# Patient Record
Sex: Male | Born: 1980 | Race: Black or African American | Hispanic: No | Marital: Single | Smoking: Never smoker
Health system: Southern US, Community
[De-identification: ages and names within clinical notes are randomized; demographics above are authoritative.]

## PROBLEM LIST (undated history)

## (undated) ENCOUNTER — Emergency Department (HOSPITAL_COMMUNITY): Discharge status: Against Medical Advice | Payer: PRIVATE HEALTH INSURANCE

## (undated) DIAGNOSIS — B20 Human immunodeficiency virus [HIV] disease: Secondary | ICD-10-CM

---

## 2004-01-20 ENCOUNTER — Emergency Department (HOSPITAL_COMMUNITY): Admission: EM | Admit: 2004-01-20 | Discharge: 2004-01-21 | Payer: Self-pay | Admitting: Emergency Medicine

## 2004-03-17 ENCOUNTER — Emergency Department (HOSPITAL_COMMUNITY): Admission: EM | Admit: 2004-03-17 | Discharge: 2004-03-17 | Payer: Self-pay | Admitting: Emergency Medicine

## 2005-07-20 ENCOUNTER — Emergency Department (HOSPITAL_COMMUNITY): Admission: EM | Admit: 2005-07-20 | Discharge: 2005-07-20 | Payer: Self-pay | Admitting: Emergency Medicine

## 2006-04-03 ENCOUNTER — Emergency Department (HOSPITAL_COMMUNITY): Admission: EM | Admit: 2006-04-03 | Discharge: 2006-04-03 | Payer: Self-pay | Admitting: Family Medicine

## 2007-01-01 ENCOUNTER — Emergency Department (HOSPITAL_COMMUNITY): Admission: EM | Admit: 2007-01-01 | Discharge: 2007-01-01 | Payer: Self-pay | Admitting: Family Medicine

## 2007-07-16 ENCOUNTER — Emergency Department (HOSPITAL_COMMUNITY): Admission: EM | Admit: 2007-07-16 | Discharge: 2007-07-16 | Payer: Self-pay | Admitting: Family Medicine

## 2007-10-12 ENCOUNTER — Emergency Department (HOSPITAL_COMMUNITY): Admission: EM | Admit: 2007-10-12 | Discharge: 2007-10-12 | Payer: Self-pay | Admitting: Family Medicine

## 2007-11-28 ENCOUNTER — Emergency Department (HOSPITAL_COMMUNITY): Admission: EM | Admit: 2007-11-28 | Discharge: 2007-11-28 | Payer: Self-pay | Admitting: Emergency Medicine

## 2007-11-29 ENCOUNTER — Emergency Department (HOSPITAL_COMMUNITY): Admission: EM | Admit: 2007-11-29 | Discharge: 2007-11-29 | Payer: Self-pay | Admitting: Family Medicine

## 2009-12-28 IMAGING — CR DG CHEST 2V
2 series · 2 of 2 positions shown · non-contrast
Comparison: None

CLINICAL DATA: Cough for a week

CHEST - 2 VIEW

[view not recorded (1 of 2)]
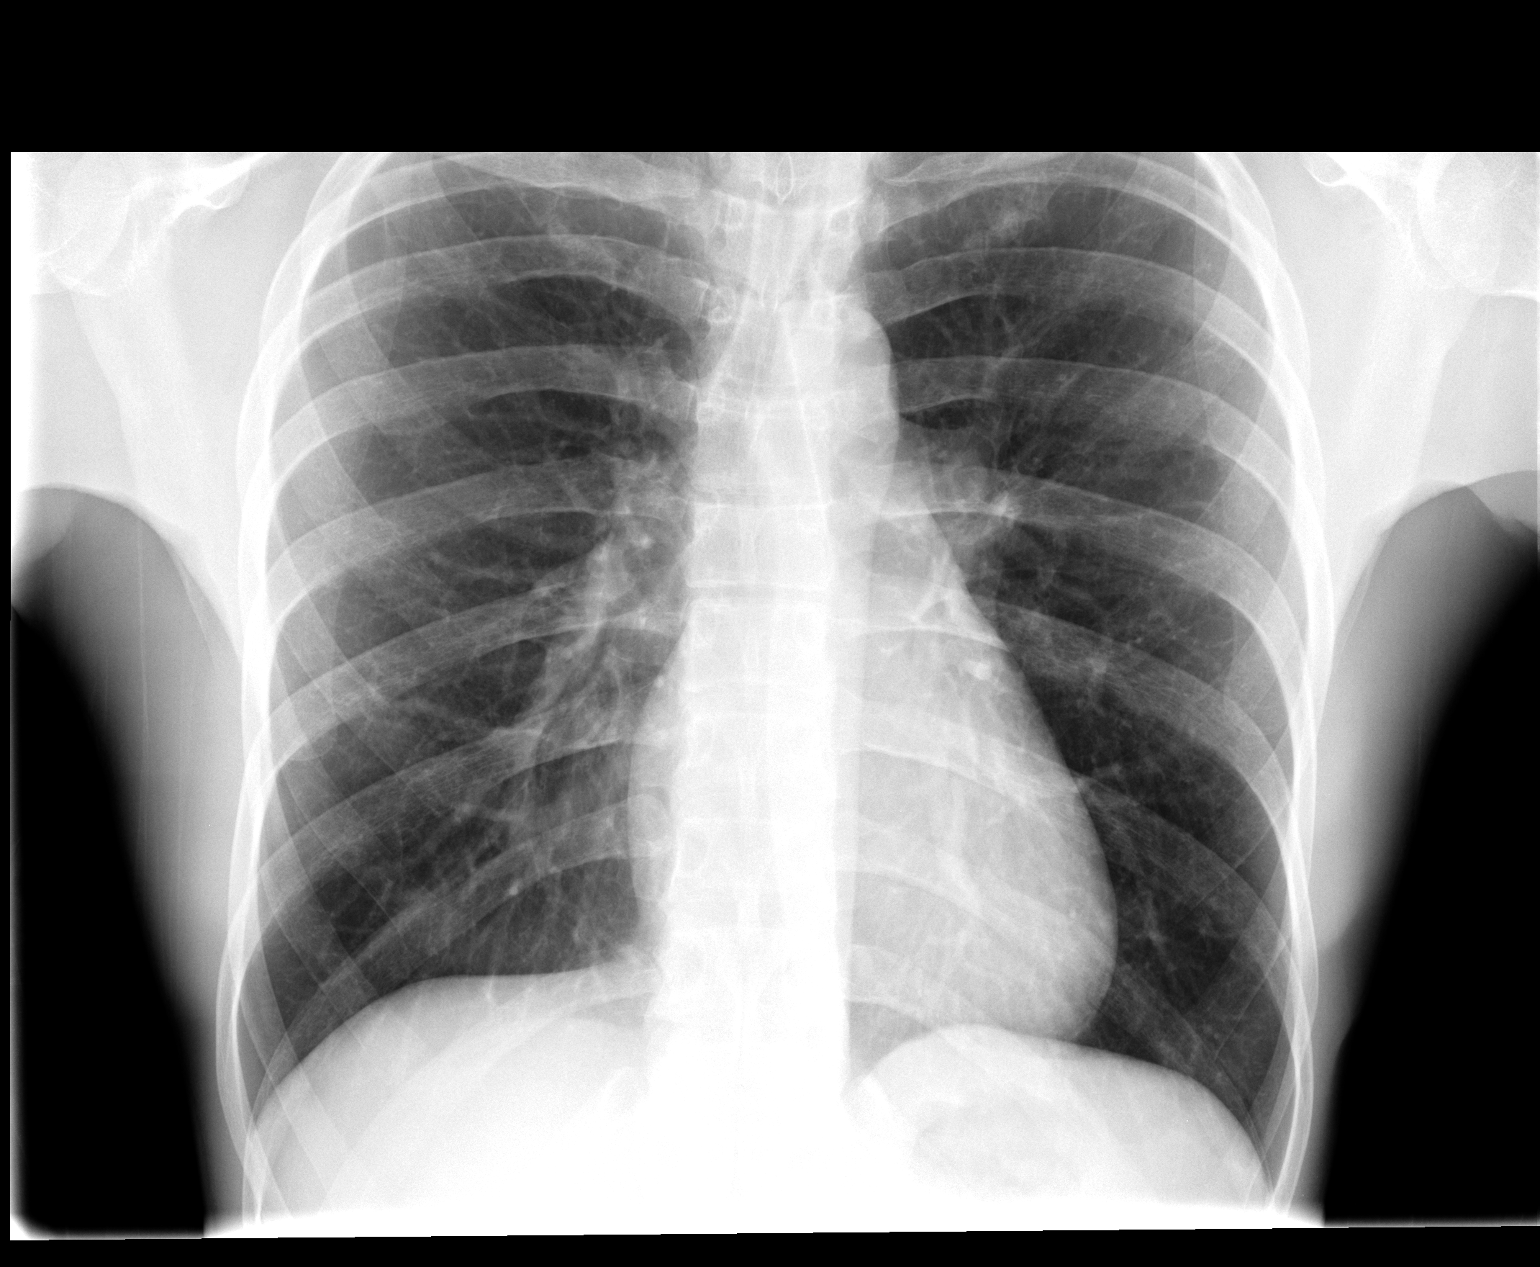

[view not recorded (2 of 2)]
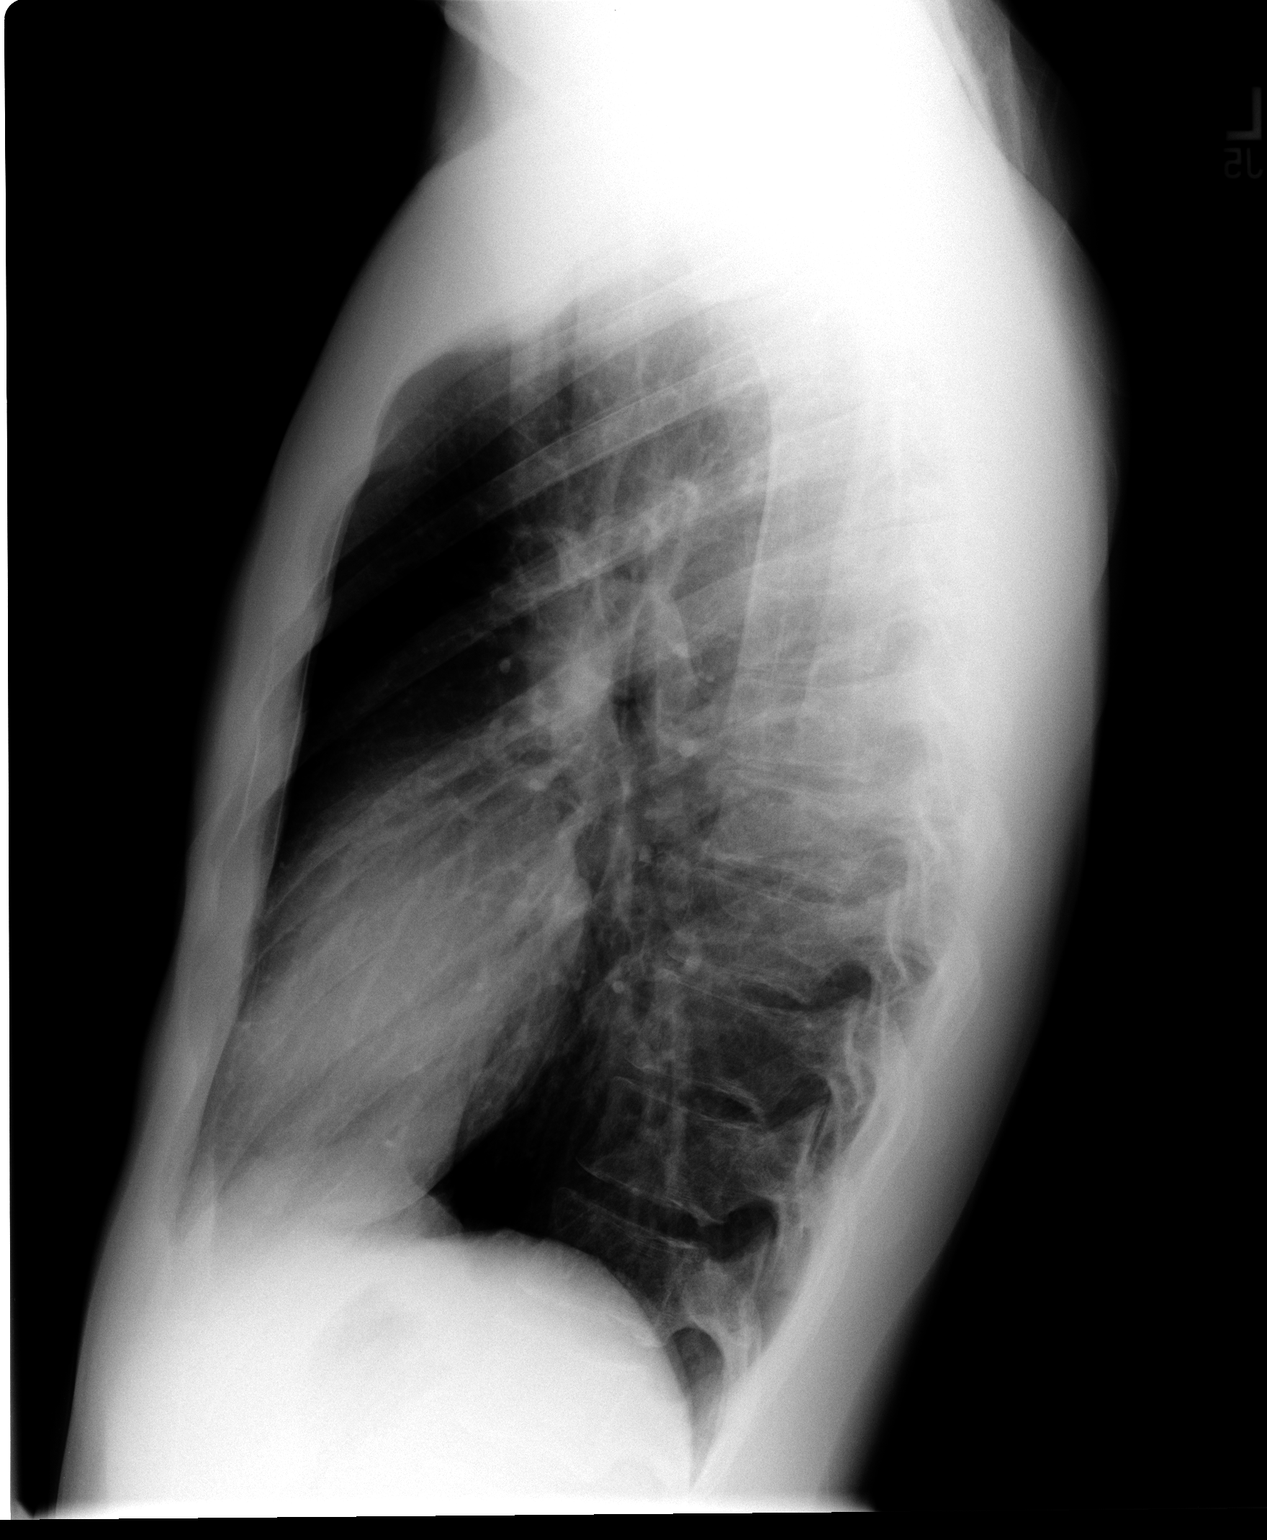

[2 of 2 positions shown; findings below may reference images not displayed]

FINDINGS: The lungs are clear but hyperaerated.  The heart is
within normal limits in size. No bony abnormality is seen.
IMPRESSION: Hyperaeration. No active lung disease.

## 2010-10-07 LAB — RPR: RPR Ser Ql: REACTIVE — AB

## 2010-10-07 LAB — T.PALLIDUM AB, IGG: T pallidum Antibodies (TP-PA): 32.1 IV — ABNORMAL HIGH (ref <1.0)

## 2011-05-30 ENCOUNTER — Emergency Department (HOSPITAL_COMMUNITY)
Admission: EM | Admit: 2011-05-30 | Discharge: 2011-05-30 | Disposition: A | Discharge status: Home / Self Care | Payer: No Typology Code available for payment source | Attending: Emergency Medicine | Admitting: Emergency Medicine

## 2011-05-30 ENCOUNTER — Emergency Department (HOSPITAL_COMMUNITY): Payer: No Typology Code available for payment source

## 2011-05-30 ENCOUNTER — Encounter (HOSPITAL_COMMUNITY): Payer: Self-pay

## 2011-05-30 DIAGNOSIS — S6000XA Contusion of unspecified finger without damage to nail, initial encounter: Secondary | ICD-10-CM | POA: Insufficient documentation

## 2011-05-30 DIAGNOSIS — Y9383 Activity, rough housing and horseplay: Secondary | ICD-10-CM | POA: Insufficient documentation

## 2011-05-30 DIAGNOSIS — S60042A Contusion of left ring finger without damage to nail, initial encounter: Secondary | ICD-10-CM

## 2011-05-30 DIAGNOSIS — W19XXXA Unspecified fall, initial encounter: Secondary | ICD-10-CM | POA: Insufficient documentation

## 2011-05-30 DIAGNOSIS — Y998 Other external cause status: Secondary | ICD-10-CM | POA: Insufficient documentation

## 2011-05-30 MED ORDER — IBUPROFEN 600 MG PO TABS: 600.0000 mg | ORAL_TABLET | Freq: Three times a day (TID) | ORAL | Status: AC | PRN

## 2011-05-30 MED ORDER — IBUPROFEN 200 MG PO TABS
600.0000 mg | ORAL_TABLET | Freq: Once | ORAL | Status: AC
  Administered 2011-05-30: 600 mg via ORAL
  Filled 2011-05-30: qty 3

## 2011-05-30 NOTE — ED Provider Notes (Signed)
History   This chart was scribed for [name], MD by Melba Coon. The patient was seen in room STRE4/STRE4 and the patient's care was started at 2:05PM.    CSN: [id number]  Arrival date & time 05/30/11  1322   First MD Initiated Contact with Patient 05/30/11 1347      Chief Complaint  Patient presents with  . Hand Pain    (Consider location/radiation/quality/duration/timing/severity/associated sxs/prior treatment) HPI Juan Mullen is a 31 y.o. male who presents to the Emergency Department complaining of constant, moderate to severe left ring finger pain with an onset last night. Pt was playing with his dog last night when he accidentally fell on his finger. Pt presented to the ED to check for a fx. Ibuprofen alleviates the pain. No HA, fever, neck pain, sore throat, rash, back pain, CP, SOB, abd pain, n/v/d, dysuria, or extremity edema, weakness, numbness, or tingling. No known allergies. No other pertinent medical symptoms.   History reviewed. No pertinent past medical history.  History reviewed. No pertinent past surgical history.  No family history on file.  History  Substance Use Topics  . Smoking status: Never Smoker   . Smokeless tobacco: Not on file  . Alcohol Use: No      Review of Systems 10 Systems reviewed and all are negative for acute change except as noted in the HPI.   Allergies  Review of patient's allergies indicates no known allergies.  Home Medications  No current outpatient prescriptions on file.  BP 128/74  Pulse 73  Temp(Src) 98.9 F (37.2 C) (Oral)  Resp 18  SpO2 98%  Physical Exam  Nursing note and vitals reviewed. Constitutional: He is oriented to person, place, and time. He appears well-developed and well-nourished. No distress.  HENT:  Head: Normocephalic and atraumatic.  Eyes: EOM are normal.  Neck: Neck supple. No tracheal deviation present.  Cardiovascular: Normal rate.   Pulmonary/Chest: Effort normal. No  respiratory distress.  Musculoskeletal: Normal range of motion.       Left 4th digit of hand: Distal phalanx tenderness w/ small amount of ecchymosis under fingernail; no tenderness of proximal phalanx  Neurological: He is alert and oriented to person, place, and time.  Skin: Skin is warm and dry.  Psychiatric: He has a normal mood and affect. His behavior is normal.    ED Course  SPLINT APPLICATION Performed by: [name] Authorized by: [name] Consent: Verbal consent obtained. Consent given by: patient Location: Left ring finger. Splint type: Buddy taping the fingers. Post-procedure: The splinted body part was neurovascularly unchanged following the procedure. Patient tolerance: Patient tolerated the procedure well with no immediate complications.   (including critical care time)  DIAGNOSTIC STUDIES: Oxygen Saturation is 98% on room air, normal by my interpretation.    COORDINATION OF CARE:  2:10PM - EDMD reviewed imaging and radiologist findings; no fx noted. EDMD taped up and stabilized the finger. F/u with PCP if necessary. Pt ready for d/c.   Labs Reviewed - No data to display No results found.   1. Contusion of left ring finger without damage to nail       MDM  Buddy tape the fingers.  No obvious fracture.  DC home in good condition  I personally performed the services described in this documentation, which was scribed in my presence. The recorded information has been reviewed and considered.          [name], MD 05/30/11 1415

## 2011-05-30 NOTE — ED Notes (Signed)
Pt. Playing with his dog last night and jammed his lt. Ring finger,.

## 2011-06-16 ENCOUNTER — Emergency Department (INDEPENDENT_AMBULATORY_CARE_PROVIDER_SITE_OTHER): Payer: PRIVATE HEALTH INSURANCE

## 2011-06-16 ENCOUNTER — Encounter (HOSPITAL_COMMUNITY): Payer: Self-pay | Admitting: *Deleted

## 2011-06-16 ENCOUNTER — Emergency Department (INDEPENDENT_AMBULATORY_CARE_PROVIDER_SITE_OTHER)
Admission: EM | Admit: 2011-06-16 | Discharge: 2011-06-16 | Disposition: A | Discharge status: Home / Self Care | Payer: PRIVATE HEALTH INSURANCE | Source: Home / Self Care | Attending: Emergency Medicine | Admitting: Emergency Medicine

## 2011-06-16 DIAGNOSIS — IMO0002 Reserved for concepts with insufficient information to code with codable children: Secondary | ICD-10-CM

## 2011-06-16 DIAGNOSIS — S29011A Strain of muscle and tendon of front wall of thorax, initial encounter: Secondary | ICD-10-CM

## 2011-06-16 MED ORDER — HYDROCODONE-ACETAMINOPHEN 5-325 MG PO TABS: 2.0000 | ORAL_TABLET | ORAL | Status: AC | PRN

## 2011-06-16 MED ORDER — IBUPROFEN 800 MG PO TABS: 800.0000 mg | ORAL_TABLET | Freq: Three times a day (TID) | ORAL | Status: AC

## 2011-06-16 NOTE — ED Provider Notes (Signed)
History     CSN: [id number]  Arrival date & time 06/16/11  4098   First MD Initiated Contact with Patient 06/16/11 1914      Chief Complaint  Patient presents with  . Shoulder Pain    (Consider location/radiation/quality/duration/timing/severity/associated sxs/prior treatment) Patient is a 31 y.o. male presenting with chest pain. The history is provided by the patient. No language interpreter was used.  Chest Pain The chest pain began 3 - 5 days ago. Chest pain occurs constantly. The chest pain is worsening. The pain is associated with breathing and coughing. At its most intense, the pain is at 6/10. The pain is currently at 6/10. The severity of the pain is moderate. The quality of the pain is described as aching. The pain does not radiate. Chest pain is worsened by deep breathing. He tried nothing for the symptoms. Risk factors include no known risk factors.    patient reports he awoke with pain in the right side of chest 2 days ago. Patient reports he thought that he had slept wrong however he has pain when he takes a deep breath patient reports pain has not improved he denies any shortness of breath he has not had any cough  History reviewed. No pertinent past medical history.  History reviewed. No pertinent past surgical history.  History reviewed. No pertinent family history.  History  Substance Use Topics  . Smoking status: Never Smoker   . Smokeless tobacco: Not on file  . Alcohol Use: Yes     occasonally      Review of Systems  Cardiovascular: Positive for chest pain.  All other systems reviewed and are negative.    Allergies  Review of patient's allergies indicates no known allergies.  Home Medications  No current outpatient prescriptions on file.  BP 136/88  Pulse 74  Temp(Src) 98.1 F (36.7 C) (Oral)  Resp 18  SpO2 100%  Physical Exam  Nursing note and vitals reviewed. Constitutional: He is oriented to person, place, and time. He appears  well-developed and well-nourished.  Eyes: Pupils are equal, round, and reactive to light.  Neck: Neck supple.  Cardiovascular: Normal rate and normal heart sounds.   Pulmonary/Chest: Effort normal and breath sounds normal. He exhibits tenderness.       Tender right anterior chest wall, pain reproduced with a deep breath  Abdominal: Soft. Bowel sounds are normal.  Musculoskeletal: Normal range of motion.  Neurological: He is alert and oriented to person, place, and time.  Skin: Skin is warm.  Psychiatric: He has a normal mood and affect.    ED Course  Procedures (including critical care time)  Labs Reviewed - No data to display No results found.   1. Muscle strain of chest wall       MDM  Chest xray normal,   Pt advised probable muscle strain.   I will treat with ibuprofen and hydrocodone.          Lonia Skinner [place], [place] 06/16/11 1951  Lonia Skinner [place], [place] 06/16/11 [phone]

## 2011-06-16 NOTE — Discharge Instructions (Signed)
Muscle Strain A muscle strain, or pulled muscle, occurs when a muscle is over-stretched. A small number of muscle fibers may also be torn. This is especially common in athletes. This happens when a sudden violent force placed on a muscle pushes it past its capacity. Usually, recovery from a pulled muscle takes 1 to 2 weeks. But complete healing will take 5 to 6 weeks. There are millions of muscle fibers. Following injury, your body will usually return to normal quickly. HOME CARE INSTRUCTIONS   While awake, apply ice to the sore muscle for 15 to 20 minutes each hour for the first 2 days. Put ice in a plastic bag and place a towel between the bag of ice and your skin.   Do not use the pulled muscle for several days. Do not use the muscle if you have pain.   You may wrap the injured area with an elastic bandage for comfort. Be careful not to bind it too tightly. This may interfere with blood circulation.   Only take over-the-counter or prescription medicines for pain, discomfort, or fever as directed by your caregiver. Do not use aspirin as this will increase bleeding (bruising) at injury site.   Warming up before exercise helps prevent muscle strains.  SEEK MEDICAL CARE IF:  There is increased pain or swelling in the affected area. MAKE SURE YOU:   Understand these instructions.   Will watch your condition.   Will get help right away if you are not doing well or get worse.  Document Released: 12/22/2004 Document Revised: 12/11/2010 Document Reviewed: 07/21/2006 [employer] Patient Information 2012 [employer]. 

## 2011-06-16 NOTE — ED Provider Notes (Signed)
Medical screening examination/treatment/procedure(s) were performed by non-physician practitioner and as supervising physician I was immediately available for consultation/collaboration.  Leslee Home, M.D.   Reuben Likes, MD 06/16/11 2126

## 2011-06-16 NOTE — ED Notes (Signed)
Pt  Reports  r  Shoulder  Pain   As  Well  As  Pain  r  Upper  Back  And  r  Side  Of  His  Chest  Which  He  Reports  Is  Worse  On  Movement  And  Deep  Breath       He at  This  Time  Is  Awake   And  Alert

## 2011-07-26 ENCOUNTER — Emergency Department (HOSPITAL_COMMUNITY)
Admission: EM | Admit: 2011-07-26 | Discharge: 2011-07-27 | Discharge status: Against Medical Advice | Payer: Self-pay | Attending: Emergency Medicine | Admitting: Emergency Medicine

## 2011-07-26 DIAGNOSIS — R6883 Chills (without fever): Secondary | ICD-10-CM | POA: Insufficient documentation

## 2011-07-27 LAB — URINALYSIS, ROUTINE W REFLEX MICROSCOPIC
Bilirubin Urine: NEGATIVE
Glucose, UA: NEGATIVE mg/dL
Hgb urine dipstick: NEGATIVE
Ketones, ur: NEGATIVE mg/dL
Leukocytes, UA: NEGATIVE
Nitrite: NEGATIVE
Protein, ur: NEGATIVE mg/dL
Specific Gravity, Urine: 1.022 (ref 1.005–1.030)
Urobilinogen, UA: 1 mg/dL (ref 0.0–1.0)
pH: 6.5 (ref 5.0–8.0)

## 2012-04-29 ENCOUNTER — Emergency Department (INDEPENDENT_AMBULATORY_CARE_PROVIDER_SITE_OTHER)
Admission: EM | Admit: 2012-04-29 | Discharge: 2012-04-29 | Disposition: A | Discharge status: Home / Self Care | Payer: No Typology Code available for payment source | Source: Home / Self Care

## 2012-04-29 ENCOUNTER — Encounter (HOSPITAL_COMMUNITY): Payer: Self-pay | Admitting: *Deleted

## 2012-04-29 DIAGNOSIS — M222X2 Patellofemoral disorders, left knee: Secondary | ICD-10-CM

## 2012-04-29 DIAGNOSIS — M25569 Pain in unspecified knee: Secondary | ICD-10-CM

## 2012-04-29 NOTE — ED Provider Notes (Signed)
Medical screening examination/treatment/procedure(s) were performed by resident physician or non-physician practitioner and as supervising physician I was immediately available for consultation/collaboration.   Kentrel Clevenger DOUGLAS MD.   Mckenlee Mangham D Bren Borys, MD 04/29/12 2026 

## 2012-04-29 NOTE — ED Provider Notes (Signed)
History     CSN: [id number]  Arrival date & time 04/29/12  1338   None     Chief Complaint  Patient presents with  . Knee Pain    (Consider location/radiation/quality/duration/timing/severity/associated sxs/prior treatment) HPI Comments: This 32 year old male is complaining of a gradual and insidious onset of left knee pain over the past week. He denies any injury or trauma. Previously he had been working out at the [employer]. The pain only occurs with flexion. When bending the knee beyond 90 he hears a pop. The source of pain is over it the superior aspect of the patella. It does not hurt when ascending or descending steps. However it is worse after sitting for prolonged period of time in getting up and walking. Otherwise there is no pain with ambulation.   History reviewed. No pertinent past medical history.  History reviewed. No pertinent past surgical history.  No family history on file.  History  Substance Use Topics  . Smoking status: Never Smoker   . Smokeless tobacco: Not on file  . Alcohol Use: Yes     Comment: occasonally      Review of Systems  Constitutional: Negative.   Respiratory: Negative.   Gastrointestinal: Negative.   Genitourinary: Negative.   Musculoskeletal:       As per HPI  Skin: Negative.   Neurological: Negative for dizziness, weakness, numbness and headaches.    Allergies  Review of patient's allergies indicates no known allergies.  Home Medications  No current outpatient prescriptions on file.  BP 127/38  Pulse 8  Temp(Src) 98.5 F (36.9 C) (Oral)  Resp 16  SpO2 100%  Physical Exam  Nursing note and vitals reviewed. Constitutional: He is oriented to person, place, and time. He appears well-developed and well-nourished.  HENT:  Head: Normocephalic and atraumatic.  Eyes: EOM are normal. Left eye exhibits no discharge.  Neck: Normal range of motion. Neck supple.  Musculoskeletal:  And swelling, deformity or discoloration. Flexion  beyond 135. Extension to 180. Negative varus, and negative valgus. Drawer test is negative no laxity. Patient able to bear full weight. He is able to squat and is produces pain over the patellofemoral ligament. Is no tenderness over the patella, bony prominences medial or lateral aspect of the knee.  Neurological: He is alert and oriented to person, place, and time. No cranial nerve deficit.  Skin: Skin is warm and dry.  Psychiatric: He has a normal mood and affect.    ED Course  Procedures (including critical care time)  Labs Reviewed - No data to display No results found.   1. Patellofemoral pain syndrome, left       MDM  Verbal and written information regarding patellofemoral pain syndrome. May apply ice as needed. May take ibuprofen or Aleve when necessary inflammation and swelling. Perform the rehabilitation maneuvers to help with the pain. May return if worse otherwise followup with primary care doctor.        Hayden Rasmussen, NP 04/29/12 [phone]

## 2012-04-29 NOTE — ED Notes (Signed)
Pt  Reports  l  Knee  Pain  For  About  1  Week        -        Worse  Yesterday           - Denys    specefic  inj   Pain when  Bends  The  Knee     Pain  On  Ambulation      Denys  Any  Other  Symptoms

## 2013-06-29 IMAGING — CR DG FINGER RING 2+V*L*
3 series · 3 of 3 positions shown · non-contrast
Comparison: None

CLINICAL DATA: Pain after falling last night.  Pain in the DIP
joint, tuft.

LEFT RING FINGER 2+V

[x finger pa left]
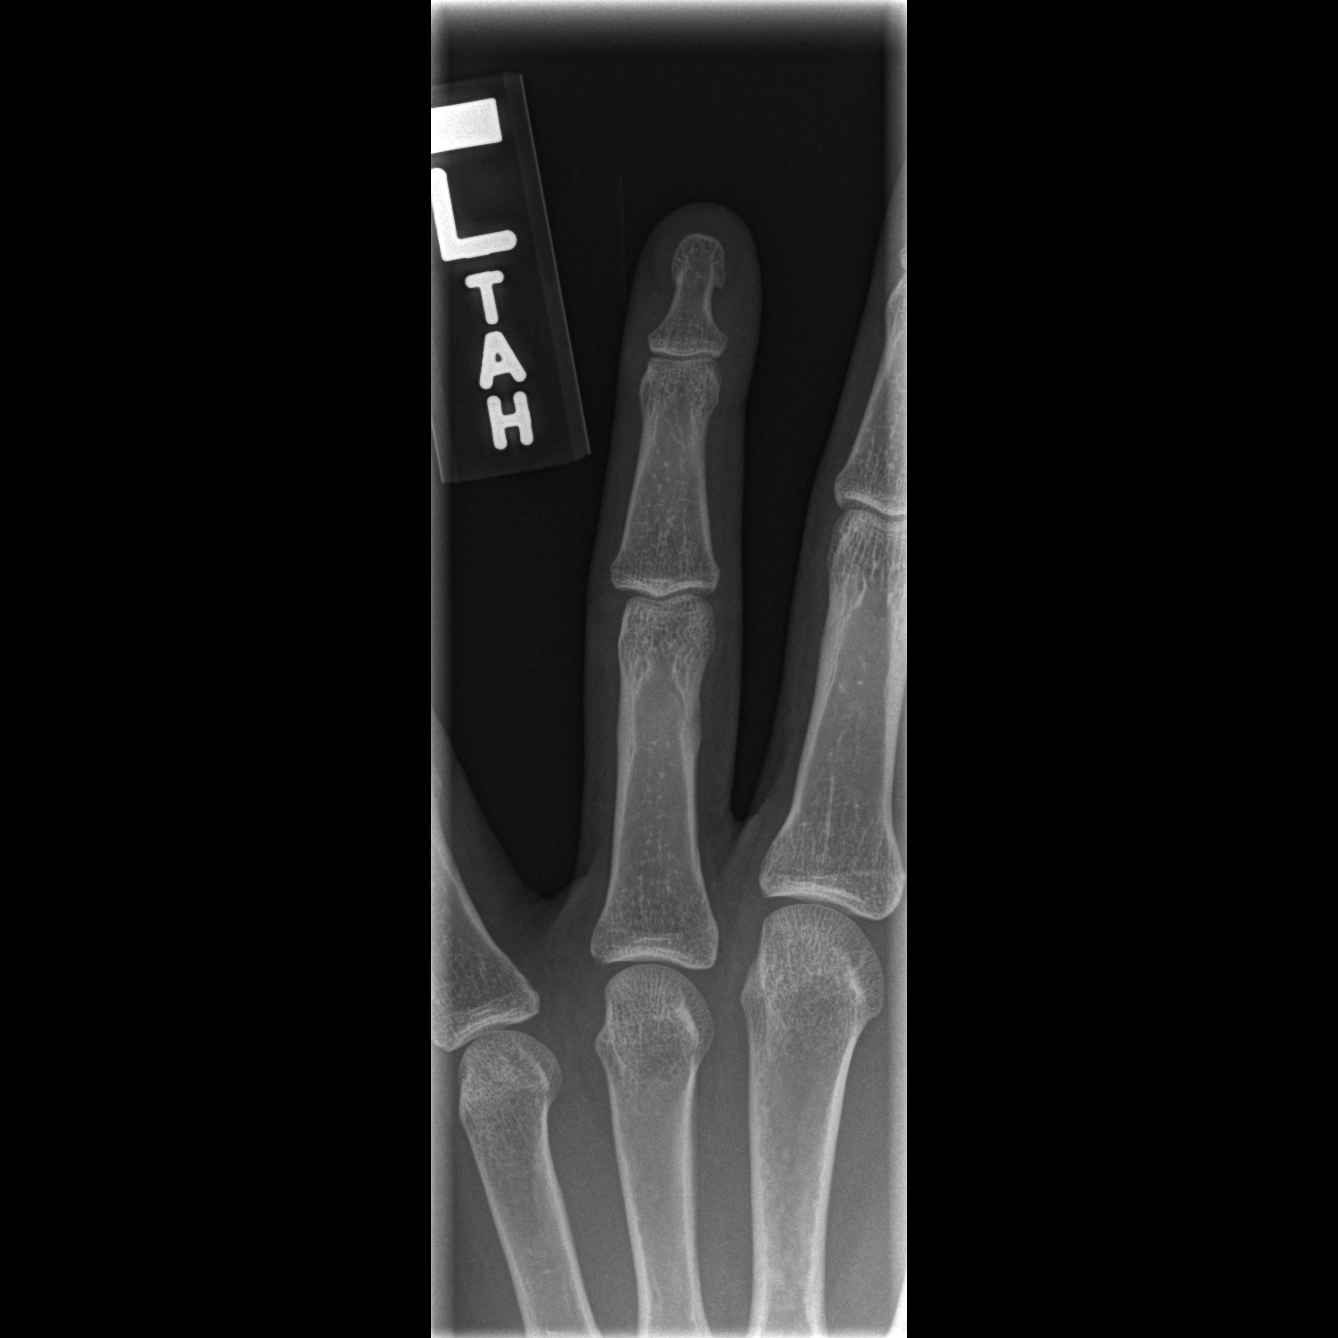

[x finger obl. left]
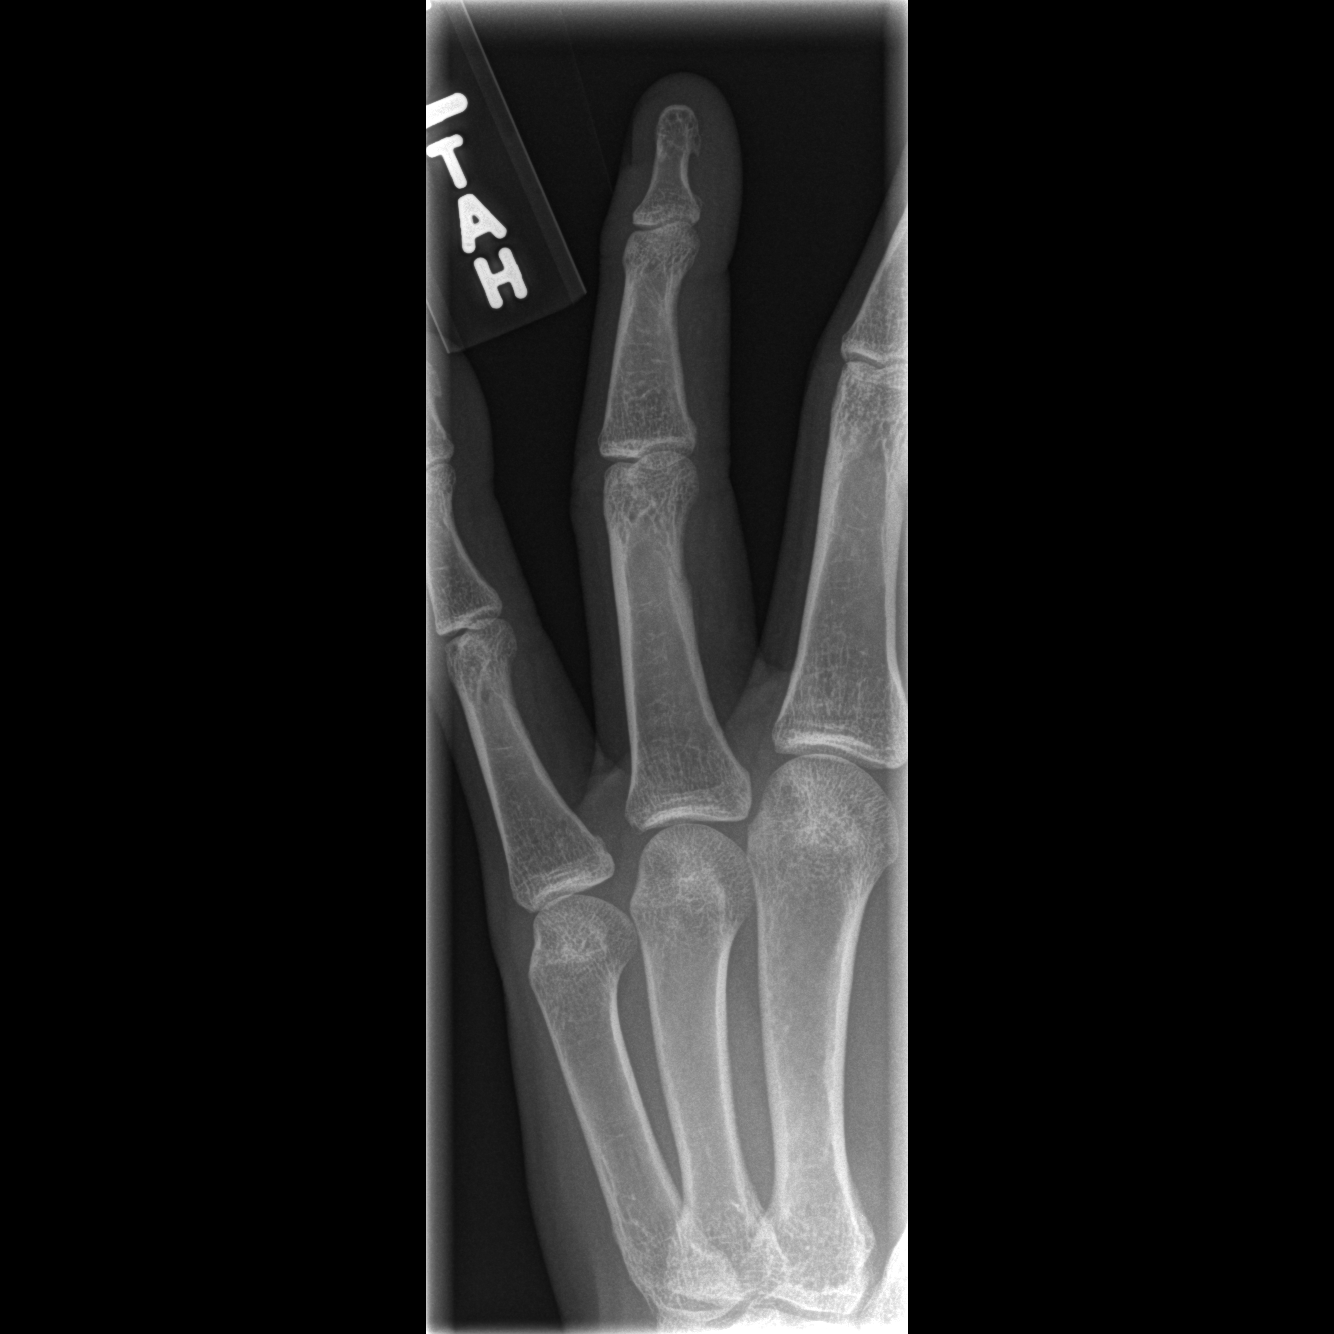

[x finger lateral left]
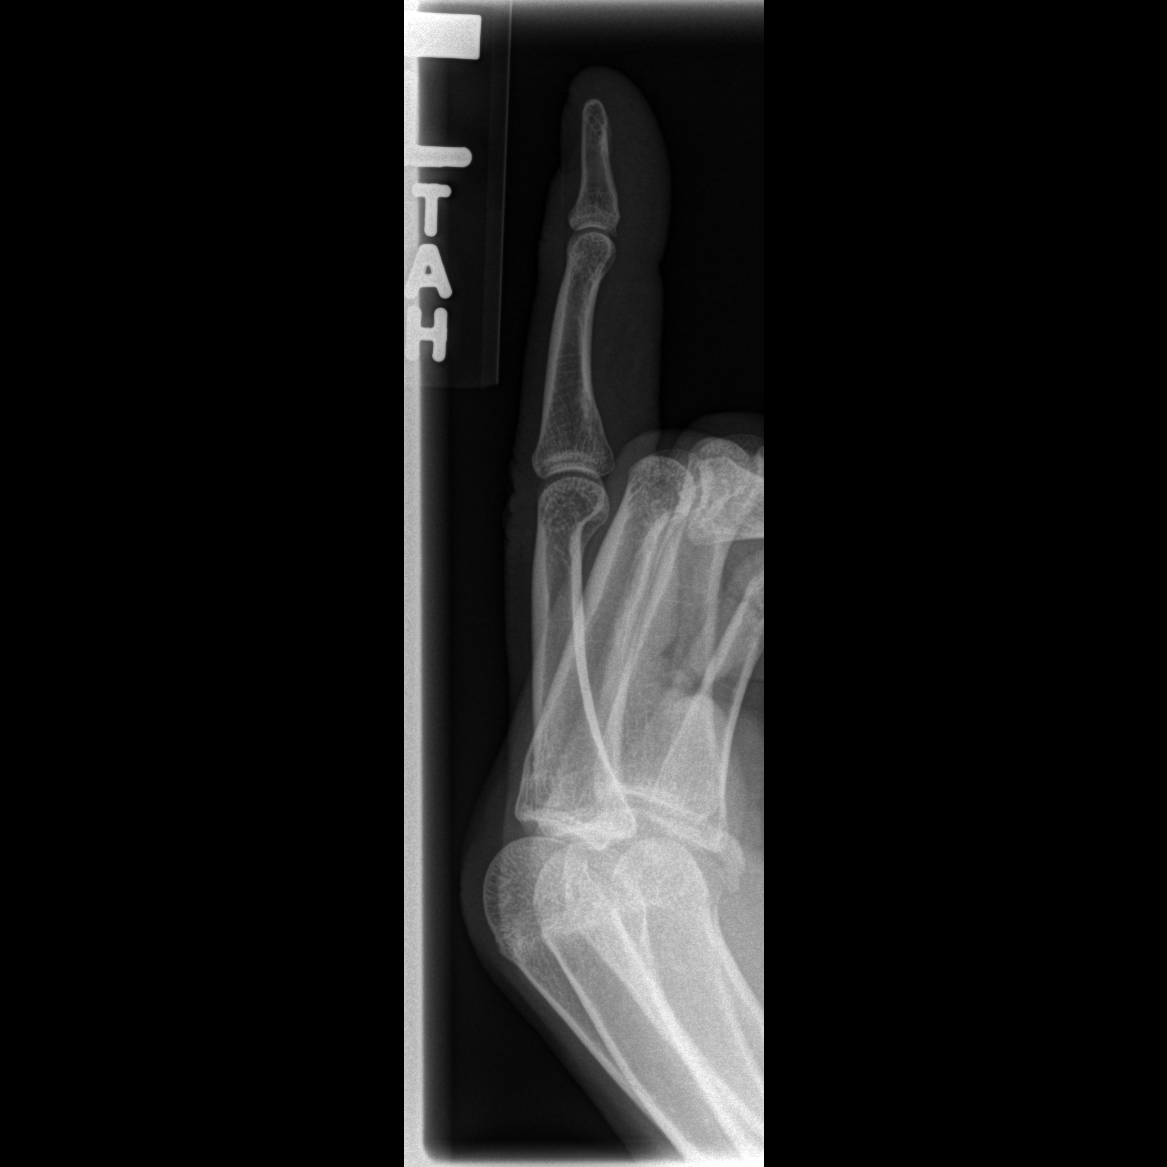

[3 of 3 positions shown; findings below may reference images not displayed]

FINDINGS: There is no evidence for acute fracture or dislocation.
No soft tissue foreign body or gas identified.
IMPRESSION: Negative exam.

## 2016-02-05 ENCOUNTER — Ambulatory Visit (HOSPITAL_COMMUNITY)
Admission: EM | Admit: 2016-02-05 | Discharge: 2016-02-05 | Disposition: A | Discharge status: Home / Self Care | Payer: Self-pay | Attending: Family Medicine | Admitting: Family Medicine

## 2016-02-05 ENCOUNTER — Encounter (HOSPITAL_COMMUNITY): Payer: Self-pay | Admitting: Emergency Medicine

## 2016-02-05 DIAGNOSIS — N50811 Right testicular pain: Secondary | ICD-10-CM

## 2016-02-05 DIAGNOSIS — Z79899 Other long term (current) drug therapy: Secondary | ICD-10-CM | POA: Insufficient documentation

## 2016-02-05 DIAGNOSIS — N451 Epididymitis: Secondary | ICD-10-CM

## 2016-02-05 DIAGNOSIS — B20 Human immunodeficiency virus [HIV] disease: Secondary | ICD-10-CM | POA: Insufficient documentation

## 2016-02-05 HISTORY — DX: Human immunodeficiency virus (HIV) disease: B20

## 2016-02-05 LAB — POCT URINALYSIS DIP (DEVICE)
Bilirubin Urine: NEGATIVE
GLUCOSE, UA: NEGATIVE mg/dL
Ketones, ur: NEGATIVE mg/dL
LEUKOCYTES UA: NEGATIVE
NITRITE: NEGATIVE
PROTEIN: NEGATIVE mg/dL
Specific Gravity, Urine: 1.015 (ref 1.005–1.030)
UROBILINOGEN UA: 1 mg/dL (ref 0.0–1.0)
pH: 7.5 (ref 5.0–8.0)

## 2016-02-05 MED ORDER — CIPROFLOXACIN HCL 500 MG PO TABS: 500.0000 mg | ORAL_TABLET | Freq: Two times a day (BID) | ORAL | 0 refills | Status: DC

## 2016-02-05 MED ORDER — HYDROCODONE-ACETAMINOPHEN 10-325 MG PO TABS: 1.0000 | ORAL_TABLET | Freq: Four times a day (QID) | ORAL | 0 refills | Status: AC | PRN

## 2016-02-05 NOTE — ED Provider Notes (Signed)
CSN: [id number]     Arrival date & time 02/05/16  1850 History   First MD Initiated Contact with Patient 02/05/16 2021     Chief Complaint  Patient presents with  . Groin Pain   (Consider location/radiation/quality/duration/timing/severity/associated sxs/prior Treatment) 36 year old male presents to clinic with 1 week history of pain and swelling in his right testicle. He denies history of trauma. Reports the pain was worse on the first two days but has improved some. He reports that his right testicle is swollen as well and he states he has seen some blood clots pass in his urine.   The history is provided by the patient.  Groin Pain     Past Medical History:  Diagnosis Date  . HIV (human immunodeficiency virus infection) (HCC)    History reviewed. No pertinent surgical history. No family history on file. Social History  Substance Use Topics  . Smoking status: Never Smoker  . Smokeless tobacco: Not on file  . Alcohol use Yes     Comment: occasonally    Review of Systems  Reason unable to perform ROS: as covered in HPI.  All other systems reviewed and are negative.   Allergies  Patient has no known allergies.  Home Medications   Prior to Admission medications   Medication Sig Start Date End Date Taking? Authorizing Provider  elvitegravir-cobicistat-emtricitabine-tenofovir (GENVOYA) 150-150-200-10 MG TABS tablet Take 1 tablet by mouth daily with breakfast.   Yes Historical Provider, MD  ciprofloxacin (CIPRO) 500 MG tablet Take 1 tablet (500 mg total) by mouth 2 (two) times daily. 02/05/16   Dorena Bodo, NP  HYDROcodone-acetaminophen (NORCO) 10-325 MG tablet Take 1 tablet by mouth every 6 (six) hours as needed for moderate pain. 02/05/16 02/10/16  Dorena Bodo, NP   Meds Ordered and Administered this Visit  Medications - No data to display  BP 125/85 (BP Location: Right Arm)   Pulse 92   Temp 99 F (37.2 C) (Oral)   Resp 18   SpO2 100%  No data  found.   Physical Exam  Constitutional: He is oriented to person, place, and time. He appears well-developed and well-nourished. No distress.  Abdominal: Soft. Bowel sounds are normal. He exhibits no distension. There is no tenderness. Hernia confirmed negative in the right inguinal area and confirmed negative in the left inguinal area.  Genitourinary: Penis normal. Right testis shows swelling and tenderness. Cremasteric reflex is not absent on the right side. Cremasteric reflex is not absent on the left side. Circumcised. No penile erythema. No discharge found.  Lymphadenopathy: No inguinal adenopathy noted on the right or left side.  Neurological: He is alert and oriented to person, place, and time.  Skin: Skin is warm and dry. Capillary refill takes less than 2 seconds. He is not diaphoretic.  Psychiatric: He has a normal mood and affect.  Nursing note and vitals reviewed.   Urgent Care Course     Procedures (including critical care time)  Labs Review Labs Reviewed  POCT URINALYSIS DIP (DEVICE) - Abnormal; Notable for the following:       Result Value   Hgb urine dipstick TRACE (*)    All other components within normal limits  URINE CULTURE    Imaging Review No results found.   Visual Acuity Review  Right Eye Distance:   Left Eye Distance:   Bilateral Distance:    Right Eye Near:   Left Eye Near:    Bilateral Near:  MDM   1. Epididymitis, right   UA and urine culture obtained in clinic.  I am concerned you may have a condition called epididymitis. I have started you on an antibiotic called Cipro, take 1 tablet twice daily. We have collected urine that will be sent off for culture. I have also prescribed a medicine for pain. I have prescribed Norco 10 mg. This medicine is a narcotic, it will cause drowsiness, and it can be addictive. Do not drink alcohol while taking this medicine and do not drive or operate machinery while taking this medicine. If you are  not experiencing improvement within the next week, I would recommend establishing with a primary care provider and following up with him or her for further evaluation.  [facility] [place] [facility] [place] reviewed prior to issuing prescription. No Rx found in previous 6 months.      Dorena BodoLawrence Dalayza Zambrana, NP 02/05/16 2106

## 2016-02-05 NOTE — Discharge Instructions (Signed)
I am concerned you may have a condition called epididymitis. I have started you on an antibiotic called Cipro, take 1 tablet twice daily. We have collected urine that will be sent off for culture. I have also prescribed a medicine for pain. I have prescribed Norco 10 mg. This medicine is a narcotic, it will cause drowsiness, and it can be addictive. Do not drink alcohol while taking this medicine and do not drive or operate machinery while taking this medicine. If you are not experiencing improvement within the next week, I would recommend establishing with a primary care provider and following up with him or her for further evaluation.

## 2016-02-05 NOTE — ED Triage Notes (Signed)
Patient has groin pain and swelling for a week intermittently.  Patient does not know of any injury.  Patient did work out at gym prior to symptoms starting.  Patient did notice some blood in urine.  Patient is noticing discomfort in sitting upright

## 2016-02-07 LAB — URINE CULTURE: CULTURE: NO GROWTH

## 2016-07-06 ENCOUNTER — Encounter (HOSPITAL_COMMUNITY): Payer: Self-pay | Admitting: Emergency Medicine

## 2016-07-06 DIAGNOSIS — K4091 Unilateral inguinal hernia, without obstruction or gangrene, recurrent: Secondary | ICD-10-CM | POA: Insufficient documentation

## 2016-07-06 DIAGNOSIS — B20 Human immunodeficiency virus [HIV] disease: Secondary | ICD-10-CM | POA: Insufficient documentation

## 2016-07-06 DIAGNOSIS — Z79899 Other long term (current) drug therapy: Secondary | ICD-10-CM | POA: Insufficient documentation

## 2016-07-06 DIAGNOSIS — I10 Essential (primary) hypertension: Secondary | ICD-10-CM | POA: Insufficient documentation

## 2016-07-06 LAB — COMPREHENSIVE METABOLIC PANEL
ALBUMIN: 4.2 g/dL (ref 3.5–5.0)
ALT: 20 U/L (ref 17–63)
AST: 29 U/L (ref 15–41)
Alkaline Phosphatase: 66 U/L (ref 38–126)
Anion gap: 10 (ref 5–15)
BUN: 12 mg/dL (ref 6–20)
CHLORIDE: 101 mmol/L (ref 101–111)
CO2: 25 mmol/L (ref 22–32)
Calcium: 10 mg/dL (ref 8.9–10.3)
Creatinine, Ser: 1.03 mg/dL (ref 0.61–1.24)
GFR calc Af Amer: >60 mL/min (ref >60)
Glucose, Bld: 102 mg/dL — ABNORMAL HIGH (ref 65–99)
POTASSIUM: 4 mmol/L (ref 3.5–5.1)
SODIUM: 136 mmol/L (ref 135–145)
Total Bilirubin: 0.7 mg/dL (ref 0.3–1.2)
Total Protein: 7.6 g/dL (ref 6.5–8.1)

## 2016-07-06 LAB — CBC
HEMATOCRIT: 44.3 % (ref 39.0–52.0)
Hemoglobin: 14.9 g/dL (ref 13.0–17.0)
MCH: 30.2 pg (ref 26.0–34.0)
MCHC: 33.6 g/dL (ref 30.0–36.0)
MCV: 89.9 fL (ref 78.0–100.0)
Platelets: 135 10*3/uL — ABNORMAL LOW (ref 150–400)
RBC: 4.93 MIL/uL (ref 4.22–5.81)
RDW: 12.6 % (ref 11.5–15.5)
WBC: 7.8 10*3/uL (ref 4.0–10.5)

## 2016-07-06 LAB — LIPASE, BLOOD: LIPASE: 33 U/L (ref 11–51)

## 2016-07-06 NOTE — ED Triage Notes (Signed)
Pt presents to ED for assessment of lower abdominal pain, mostly to the right side.  Pt is schedule for a scan with his PCP on July 9th, but states pain is severe and needing relief.  Pt denies n/v/d, denies changes in urination

## 2016-07-07 ENCOUNTER — Emergency Department (HOSPITAL_COMMUNITY)
Admission: EM | Admit: 2016-07-07 | Discharge: 2016-07-07 | Disposition: A | Discharge status: Home / Self Care | Payer: Self-pay | Attending: Emergency Medicine | Admitting: Emergency Medicine

## 2016-07-07 DIAGNOSIS — K4091 Unilateral inguinal hernia, without obstruction or gangrene, recurrent: Secondary | ICD-10-CM

## 2016-07-07 DIAGNOSIS — I10 Essential (primary) hypertension: Secondary | ICD-10-CM

## 2016-07-07 MED ORDER — HYDROCHLOROTHIAZIDE 25 MG PO TABS: 25.0000 mg | ORAL_TABLET | Freq: Every day | ORAL | 0 refills | Status: DC

## 2016-07-07 MED ORDER — HYDROCHLOROTHIAZIDE 25 MG PO TABS
25.0000 mg | ORAL_TABLET | Freq: Every day | ORAL | Status: DC
  Administered 2016-07-07: 25 mg via ORAL
  Filled 2016-07-07: qty 1

## 2016-07-07 NOTE — ED Provider Notes (Signed)
[facility]-EMERGENCY DEPT Provider Note   CSN: [id number] Arrival date & time: 07/06/16  2045     History   Chief Complaint Chief Complaint  Patient presents with  . Abdominal Pain    HPI Juan Mullen is a 36 y.o. male.  Patient has a known right inguinal hernia.  Tonight it became very painful and swollen.  He became concerned. Denies any dysuria, constipation, nausea, vomiting, diarrhea. Also has been told in the past that he has elevated blood pressure that his physician has been watching He has an appointment July 9 with his medical doctor. [facility].  Does have a history of HIV.  He takes his medication on a regular basis.  His viral load is undetectable has regular medical follow-up      Past Medical History:  Diagnosis Date  . HIV (human immunodeficiency virus infection) (HCC)     There are no active problems to display for this patient.   History reviewed. No pertinent surgical history.     Home Medications    Prior to Admission medications   Medication Sig Start Date End Date Taking? Authorizing Provider  elvitegravir-cobicistat-emtricitabine-tenofovir (GENVOYA) 150-150-200-10 MG TABS tablet Take 1 tablet by mouth daily with breakfast.   Yes [provider]  ciprofloxacin (CIPRO) 500 MG tablet Take 1 tablet (500 mg total) by mouth 2 (two) times daily. Patient not taking: Reported on 07/07/2016 02/05/16   Dorena BodoKennard, Lawrence, NP  hydrochlorothiazide (HYDRODIURIL) 25 MG tablet Take 1 tablet (25 mg total) by mouth daily. 07/07/16   Earley FavorSchulz, Geza Beranek, NP    Family History History reviewed. No pertinent family history.  Social History Social History  Substance Use Topics  . Smoking status: Never Smoker  . Smokeless tobacco: Never Used  . Alcohol use Yes     Comment: occasonally     Allergies   Patient has no known allergies.   Review of Systems Review of Systems  Constitutional: Negative for fever.  Gastrointestinal: Positive for abdominal pain.    Genitourinary: Negative for dysuria, frequency and testicular pain.  Neurological: Negative for dizziness and headaches.  All other systems reviewed and are negative.    Physical Exam Updated Vital Signs BP (!) 144/88 (BP Location: Left Arm)   Pulse 73   Temp 98.6 F (37 C) (Oral)   Resp 18   SpO2 100%   Physical Exam  Constitutional: He appears well-developed and well-nourished. No distress.  HENT:  Head: Normocephalic.  Eyes: Pupils are equal, round, and reactive to light.  Neck: Normal range of motion.  Cardiovascular: Normal rate.   Pulmonary/Chest: Effort normal.  Abdominal: Soft. He exhibits no distension. There is no tenderness. A hernia is present. Hernia confirmed positive in the right inguinal area.    Nursing note and vitals reviewed.    ED Treatments / Results  Labs (all labs ordered are listed, but only abnormal results are displayed) Labs Reviewed  COMPREHENSIVE METABOLIC PANEL - Abnormal; Notable for the following:       Result Value   Glucose, Bld 102 (*)    All other components within normal limits  CBC - Abnormal; Notable for the following:    Platelets 135 (*)    All other components within normal limits  LIPASE, BLOOD  URINALYSIS, ROUTINE W REFLEX MICROSCOPIC    EKG  EKG Interpretation None       Radiology No results found.  Procedures Procedures (including critical care time)  Medications Ordered in ED Medications  hydrochlorothiazide (HYDRODIURIL) tablet 25 mg (not  administered)     Initial Impression / Assessment and Plan / ED Course  I have reviewed the triage vital signs and the nursing notes.  Pertinent labs & imaging results that were available during my care of the patient were reviewed by me and considered in my medical decision making (see chart for details).      At this time, patient does not have incarcerated hernia will not need immediate surgery.  He will follow-up with his primary care physician as  previously scheduled on July 9.  He also will be started on hydrochlorothiazide for his diastolic hypertension.  Final Clinical Impressions(s) / ED Diagnoses   Final diagnoses:  Unilateral recurrent inguinal hernia without obstruction or gangrene  Hypertension, unspecified type    New Prescriptions New Prescriptions   HYDROCHLOROTHIAZIDE (HYDRODIURIL) 25 MG TABLET    Take 1 tablet (25 mg total) by mouth daily.     Earley Favor, NP 07/07/16 0127    Ward, Layla Maw, DO 07/07/16 [phone]

## 2016-07-07 NOTE — Discharge Instructions (Signed)
You have been given a prescription for hydrochlorothiazide which is a diuretic.  Please take this as directed daily.  Please make sure keep your appointment with your doctor on July 9 as scheduled. You also have an inguinal hernia that is easily reduced.  At this time.  Please try to follow the below listed precautions Avoid heavy lifting, pushing, pulling, straining Try to keep your bowels soft and avoid constipation If at anytime your hernia recurs becomes painful, hard and you are unable to reduce this.  You are to return to the closest facility for evaluation and treatment

## 2017-03-15 ENCOUNTER — Ambulatory Visit (HOSPITAL_COMMUNITY)
Admission: EM | Admit: 2017-03-15 | Discharge: 2017-03-15 | Disposition: A | Discharge status: Home / Self Care | Payer: PRIVATE HEALTH INSURANCE | Attending: Family Medicine | Admitting: Family Medicine

## 2017-03-15 ENCOUNTER — Encounter (HOSPITAL_COMMUNITY): Payer: Self-pay | Admitting: Emergency Medicine

## 2017-03-15 DIAGNOSIS — W268XXA Contact with other sharp object(s), not elsewhere classified, initial encounter: Secondary | ICD-10-CM | POA: Diagnosis not present

## 2017-03-15 DIAGNOSIS — S61431A Puncture wound without foreign body of right hand, initial encounter: Secondary | ICD-10-CM | POA: Diagnosis not present

## 2017-03-15 MED ORDER — AMOXICILLIN-POT CLAVULANATE 875-125 MG PO TABS: 1.0000 | ORAL_TABLET | Freq: Two times a day (BID) | ORAL | 0 refills | Status: DC

## 2017-03-15 NOTE — ED Triage Notes (Signed)
Pt here with right hand pain after falling this weekend

## 2017-03-16 NOTE — ED Provider Notes (Signed)
  [facility]   [id number] 03/15/17 Arrival Time: 1322  ASSESSMENT & PLAN:  1. Puncture wound of right hand without foreign body, initial encounter     Meds ordered this encounter  Medications  . amoxicillin-clavulanate (AUGMENTIN) 875-125 MG tablet    Sig: Take 1 tablet by mouth every 12 (twelve) hours.    Dispense:  20 tablet    Refill:  0   Wound care instructions given. No active signs of infection; treating empirically. Will follow up here if worsening or failing to improve as anticipated. Reviewed expectations re: course of current medical issues. Questions answered. Outlined signs and symptoms indicating need for more acute intervention. Patient verbalized understanding. After Visit Summary given.   SUBJECTIVE:  Juan Mullen is a 37 y.o. male who presents with a skin complaint.   Location: R palm/thenar area Onset: abrupt Duration: 2 days Reports puncture wound from ice. Minimal bleeding at the time. Washed well. Has been keeping area covered. Mild swelling. A little pain with thumb movement. No extremity sensation changes or weakness. Feels Td is UTD. No OTC analgesics needed. No specific aggravating or alleviating factors reported.   ROS: As per HPI.  OBJECTIVE: Vitals:   03/15/17 1445  BP: (!) 153/96  Pulse: 89  Resp: 18  Temp: 98.6 F (37 C)  TempSrc: Oral  SpO2: 100%    General appearance: alert; no distress Lungs: clear to auscultation bilaterally Heart: regular rate and rhythm Extremities: no edema Skin: warm and dry; R thenar area with 0.5-1cm puncture wound/skin tear; no bleeding; thumb with FROM; mild swelling without erythema or bruising; all finger sensation intact; normal capillary refill Psychological: alert and cooperative; normal mood and affect  No Known Allergies  Past Medical History:  Diagnosis Date  . HIV (human immunodeficiency virus infection) (HCC)    Social History   Socioeconomic History  . Marital  status: Single    Spouse name: Not on file  . Number of children: Not on file  . Years of education: Not on file  . Highest education level: Not on file  Social Needs  . Financial resource strain: Not on file  . Food insecurity - worry: Not on file  . Food insecurity - inability: Not on file  . Transportation needs - medical: Not on file  . Transportation needs - non-medical: Not on file  Occupational History  . Not on file  Tobacco Use  . Smoking status: Never Smoker  . Smokeless tobacco: Never Used  Substance and Sexual Activity  . Alcohol use: Yes    Comment: occasonally  . Drug use: No  . Sexual activity: Not on file  Other Topics Concern  . Not on file  Social History Narrative  . Not on file    History reviewed. No pertinent surgical history.   Mardella LaymanHagler, Keilin Gamboa, MD 03/16/17 [phone]

## 2017-09-05 ENCOUNTER — Other Ambulatory Visit: Payer: Self-pay

## 2017-09-05 ENCOUNTER — Encounter (HOSPITAL_COMMUNITY): Payer: Self-pay

## 2017-09-05 ENCOUNTER — Ambulatory Visit (HOSPITAL_COMMUNITY)
Admission: EM | Admit: 2017-09-05 | Discharge: 2017-09-05 | Disposition: A | Discharge status: Home / Self Care | Payer: PRIVATE HEALTH INSURANCE | Attending: Family Medicine | Admitting: Family Medicine

## 2017-09-05 DIAGNOSIS — I10 Essential (primary) hypertension: Secondary | ICD-10-CM

## 2017-09-05 DIAGNOSIS — R002 Palpitations: Secondary | ICD-10-CM

## 2017-09-05 DIAGNOSIS — R000 Tachycardia, unspecified: Secondary | ICD-10-CM | POA: Diagnosis not present

## 2017-09-05 DIAGNOSIS — R5383 Other fatigue: Secondary | ICD-10-CM | POA: Diagnosis not present

## 2017-09-05 DIAGNOSIS — Z21 Asymptomatic human immunodeficiency virus [HIV] infection status: Secondary | ICD-10-CM | POA: Diagnosis not present

## 2017-09-05 MED ORDER — HYDROCHLOROTHIAZIDE 25 MG PO TABS: 25.0000 mg | ORAL_TABLET | Freq: Every day | ORAL | 0 refills | Status: DC

## 2017-09-05 MED ORDER — HYDROCHLOROTHIAZIDE 25 MG PO TABS: 25.0000 mg | ORAL_TABLET | Freq: Every day | ORAL | 0 refills | Status: AC

## 2017-09-05 NOTE — Discharge Instructions (Signed)
Take hydrochlorothiazide once a day. This is for your blood pressure. Make sure that you eat well and take good care of yourself. Avoid caffeine. Follow-up with your primary care doctor to further evaluate and treat your blood pressure

## 2017-09-05 NOTE — ED Provider Notes (Signed)
[facility]    CSN: [id number] Arrival date & time: 09/05/17  1556     History   Chief Complaint Chief Complaint  Patient presents with  . Nausea    HPI Juan Mullen is a 37 y.o. male.   HPI  Patient states that he is been feeling overly tired for the last week.  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  He feels like he needs a vacation.  He is feeling quite stressed.  He states that he has had intermittent spells of rapid heartbeat.  When he gets a rapid heartbeat he gets kind of nauseated and anxious.  These happen randomly.  They happen more often at rest than when he is busy.  No association with food or caffeine.  No shortness of breath, no chest pain.  No numbness or weakness in arms or legs.  He does not have any vomiting.  He does not have any abdominal pain.  He does not have any history of GERD or stomach upset. He does have a history of hypertension in the past.  He previously took hydrochlorothiazide.  He states when his blood pressure came back to normal he stopped the medication.  He has not been on it for some time. He is HIV positive.  He is compliant with going to infection control clinic and getting his medicine and lab work every 3 months.  He is compliant with his medicine.  Is currently not in a relationship.   Past Medical History:  Diagnosis Date  . HIV (human immunodeficiency virus infection) (HCC)     There are no active problems to display for this patient.   History reviewed. No pertinent surgical history.     Home Medications    Prior to Admission medications   Medication Sig Start Date End Date Taking? Authorizing Provider  elvitegravir-cobicistat-emtricitabine-tenofovir (GENVOYA) 150-150-200-10 MG TABS tablet Take 1 tablet by mouth daily with breakfast.   Yes [provider]  hydrochlorothiazide (HYDRODIURIL) 25 MG tablet Take 1 tablet (25 mg total) by mouth daily.  09/05/17   Eustace Moore, MD    Family History History reviewed. No pertinent family history.  Social History Social History   Tobacco Use  . Smoking status: Never Smoker  . Smokeless tobacco: Never Used  Substance Use Topics  . Alcohol use: Yes    Comment: occasonally  . Drug use: No     Allergies   Patient has no known allergies.   Review of Systems Review of Systems  Constitutional: Negative for chills and fever.  HENT: Negative for ear pain and sore throat.   Eyes: Negative for pain and visual disturbance.  Respiratory: Negative for cough and shortness of breath.   Cardiovascular: Positive for palpitations. Negative for chest pain.       Spells of tachycardia  Gastrointestinal: Positive for nausea. Negative for abdominal pain and vomiting.       Spells and nausea  Genitourinary: Negative for dysuria and hematuria.  Musculoskeletal: Negative for arthralgias and back pain.  Skin: Negative for color change and rash.  Neurological: Positive for light-headedness. Negative for seizures and syncope.       Positive lightheadedness  Psychiatric/Behavioral: Positive for sleep disturbance. The patient is nervous/anxious.   All other systems reviewed and are negative.    Physical Exam Triage Vital Signs ED Triage Vitals  Enc Vitals Group     BP 09/05/17 1639 Marland Kitchen)  152/99     Pulse Rate 09/05/17 1639 87     Resp 09/05/17 1639 17     Temp 09/05/17 1639 97.9 F (36.6 C)     Temp Source 09/05/17 1639 Oral     SpO2 09/05/17 1639 100 %   No data found.  Updated Vital Signs BP (!) 152/99 (BP Location: Left Arm)   Pulse 87   Temp 97.9 F (36.6 C) (Oral)   Resp 17   SpO2 100%       Physical Exam  Constitutional: He appears well-developed and well-nourished. No distress.  HENT:  Head: Normocephalic and atraumatic.  Right Ear: External ear normal.  Left Ear: External ear normal.  Mouth/Throat: Oropharynx is clear and moist.  Eyes: Pupils are equal, round, and  reactive to light. Conjunctivae and EOM are normal.  Neck: Normal range of motion. No thyromegaly present.  Cardiovascular: Normal rate, regular rhythm, normal heart sounds and intact distal pulses.  No murmur heard. When patient was speaking he said he had tachycardia.  I auscultated and heard a heart rate of 110.  By the time we got the EKG machine in the room, it was down to 87.  Pulmonary/Chest: Effort normal and breath sounds normal. No respiratory distress.  Abdominal: Soft. Bowel sounds are normal. He exhibits no distension. There is no tenderness.  No organomegaly  Musculoskeletal: Normal range of motion. He exhibits no edema.  Lymphadenopathy:    He has no cervical adenopathy.  Neurological: He is alert. He displays normal reflexes.  Skin: Skin is warm and dry.  Psychiatric: He has a normal mood and affect. His behavior is normal.     UC Treatments / Results  Labs (all labs ordered are listed, but only abnormal results are displayed) Labs Reviewed - No data to display  EKG-computer read possible anterior MI, age undetermined.  To my review there are no Q waves.  I believe his EKG is normal.  Normal sinus rhythm rate of 87.  No ST changes. None  Radiology No results found.  Procedures Procedures (including critical care time)  Medications Ordered in UC Medications - No data to display  Initial Impression / Assessment and Plan / UC Course  I have reviewed the triage vital signs and the nursing notes.  Pertinent labs & imaging results that were available during my care of the patient were reviewed by me and considered in my medical decision making (see chart for details).     Discussed with patient that I feel he is having some depression and anxiety symptoms.  We discussed tachycardia.  I think he is having some sinus tachycardia related to anxiety he needs to reduce his stress and his caffeine. Final Clinical Impressions(s) / UC Diagnoses   Final diagnoses:    Tachycardia, unspecified  Essential hypertension     Discharge Instructions     Take hydrochlorothiazide once a day. This is for your blood pressure. Make sure that you eat well and take good care of yourself. Avoid caffeine. Follow-up with your primary care doctor to further evaluate and treat your blood pressure   ED Prescriptions    Medication Sig Dispense Auth. Provider   hydrochlorothiazide (HYDRODIURIL) 25 MG tablet Take 1 tablet (25 mg total) by mouth daily. 90 tablet Eustace Moore, MD     Controlled Substance Prescriptions [place] Controlled Substance Registry consulted? Not Applicable   Eustace Moore, MD 09/05/17 2021

## 2017-09-05 NOTE — ED Triage Notes (Signed)
Pt presents to [facility] for nausea and fatigue x1 week, pt states he also has a little fluttering in heart.

## 2020-01-25 ENCOUNTER — Other Ambulatory Visit: Payer: Self-pay

## 2020-01-25 ENCOUNTER — Encounter (HOSPITAL_COMMUNITY): Payer: Self-pay

## 2020-01-25 ENCOUNTER — Ambulatory Visit (HOSPITAL_COMMUNITY)
Admission: EM | Admit: 2020-01-25 | Discharge: 2020-01-25 | Disposition: A | Discharge status: Home / Self Care | Payer: BLUE CROSS/BLUE SHIELD | Attending: Student | Admitting: Student

## 2020-01-25 DIAGNOSIS — R350 Frequency of micturition: Secondary | ICD-10-CM | POA: Diagnosis present

## 2020-01-25 DIAGNOSIS — I10 Essential (primary) hypertension: Secondary | ICD-10-CM | POA: Insufficient documentation

## 2020-01-25 DIAGNOSIS — F411 Generalized anxiety disorder: Secondary | ICD-10-CM | POA: Insufficient documentation

## 2020-01-25 LAB — POCT URINALYSIS DIPSTICK, ED / UC
Bilirubin Urine: NEGATIVE
Glucose, UA: NEGATIVE mg/dL
Hgb urine dipstick: NEGATIVE
Ketones, ur: NEGATIVE mg/dL
Nitrite: NEGATIVE
Protein, ur: NEGATIVE mg/dL
Specific Gravity, Urine: 1.025 (ref 1.005–1.030)
Urobilinogen, UA: 1 mg/dL (ref 0.0–1.0)
pH: 6 (ref 5.0–8.0)

## 2020-01-25 NOTE — ED Provider Notes (Signed)
[facility]    CSN: [id number] Arrival date & time: 01/25/20  1724      History   Chief Complaint Chief Complaint  Patient presents with  . UTI  . Urinary Urgency  . Urinary Frequency    HPI Juan Mullen is a 40 y.o. male presenting today with few hours of urinary frequency this morning. No history of UTIs in the past. Adamantly denies STI symptoms or exposure. History of HIV and anxiety (per pt). This patient presents today with few episodes of urinary frequency this morning. He states he's been overwhelmingly anxious for the last few weeks, related to his dog's illness. Has been prescribed hydroxyzine by PCP but has not started this due to anxiety about taking it. Has an appointment with PCP 12 hours from now but became very concerned about his urinary symptoms and wished to be seen sooner. Denies hematuria, dysuria, urgency, back pain, weak stream, n/v/d/abd pain, fevers/chills, abdnormal discharge, penile lesions. No history of UTIs. He also mentions that he hasn't taken his blood pressure medication today. He states that he does not feel suicidal or homicidal and that he has much to live for. Denies headaches, vision changes, chest pain.    HPI  Past Medical History:  Diagnosis Date  . HIV (human immunodeficiency virus infection) (HCC)     There are no problems to display for this patient.   History reviewed. No pertinent surgical history.     Home Medications    Prior to Admission medications   Medication Sig Start Date End Date Taking? Authorizing Provider  elvitegravir-cobicistat-emtricitabine-tenofovir (GENVOYA) 150-150-200-10 MG TABS tablet Take 1 tablet by mouth daily with breakfast.    [provider]  hydrochlorothiazide (HYDRODIURIL) 25 MG tablet Take 1 tablet (25 mg total) by mouth daily. 09/05/17   Eustace Moore, MD    Family History History reviewed. No pertinent family history.  Social History Social History   Tobacco  Use  . Smoking status: Never Smoker  . Smokeless tobacco: Never Used  Substance Use Topics  . Alcohol use: Yes    Comment: occasonally  . Drug use: No     Allergies   Patient has no known allergies.   Review of Systems Review of Systems  Constitutional: Negative for appetite change, chills, diaphoresis and fever.  Respiratory: Negative for shortness of breath.   Cardiovascular: Negative for chest pain.  Gastrointestinal: Negative for abdominal pain, blood in stool, constipation, diarrhea, nausea and vomiting.  Genitourinary: Positive for frequency. Negative for decreased urine volume, difficulty urinating, dysuria, flank pain, genital sores, hematuria and urgency.  Musculoskeletal: Negative for back pain.  Neurological: Negative for dizziness, weakness and light-headedness.  All other systems reviewed and are negative.    Physical Exam Triage Vital Signs ED Triage Vitals  Enc Vitals Group     BP 01/25/20 1817 (!) 145/94     Pulse Rate 01/25/20 1817 91     Resp 01/25/20 1817 18     Temp 01/25/20 1817 98.2 F (36.8 C)     Temp Source 01/25/20 1817 Oral     SpO2 01/25/20 1817 98 %     Weight --      Height --      Head Circumference --      Peak Flow --      Pain Score 01/25/20 1816 0     Pain Loc --      Pain Edu? --      Excl. in GC? --  No data found.  Updated Vital Signs BP (!) 145/94 (BP Location: Right Arm)   Pulse 91   Temp 98.2 F (36.8 C) (Oral)   Resp 18   SpO2 98%   Visual Acuity Right Eye Distance:   Left Eye Distance:   Bilateral Distance:    Right Eye Near:   Left Eye Near:    Bilateral Near:     Physical Exam Vitals reviewed.  Constitutional:      General: He is not in acute distress.    Appearance: Normal appearance. He is not ill-appearing.  HENT:     Head: Normocephalic and atraumatic.  Cardiovascular:     Rate and Rhythm: Normal rate and regular rhythm.     Heart sounds: Normal heart sounds.  Pulmonary:     Effort:  Pulmonary effort is normal.     Breath sounds: Normal breath sounds.  Abdominal:     General: Bowel sounds are normal. There is no distension.     Palpations: Abdomen is soft. There is no mass.     Tenderness: There is no abdominal tenderness. There is no right CVA tenderness, left CVA tenderness, guarding or rebound.  Neurological:     General: No focal deficit present.     Mental Status: He is alert and oriented to person, place, and time. Mental status is at baseline.  Psychiatric:        Mood and Affect: Mood is anxious.        Behavior: Behavior normal.        Thought Content: Thought content normal.        Judgment: Judgment normal.      UC Treatments / Results  Labs (all labs ordered are listed, but only abnormal results are displayed) Labs Reviewed  POCT URINALYSIS DIPSTICK, ED / UC - Abnormal; Notable for the following components:      Result Value   Leukocytes,Ua TRACE (*)    All other components within normal limits  URINE CULTURE    EKG   Radiology No results found.  Procedures Procedures (including critical care time)  Medications Ordered in UC Medications - No data to display  Initial Impression / Assessment and Plan / UC Course  I have reviewed the triage vital signs and the nursing notes.  Pertinent labs & imaging results that were available during my care of the patient were reviewed by me and considered in my medical decision making (see chart for details).     UA with trace leuk, otherwise wnl. Culture sent. Exam benign, afebrile/nontachy. I suspect patient's urinary frequency is related to anxiety rather than true cystitis. He has already been prescribed hydroxyzine by PCP; again emphasized the importance of taking this medication in order to resolve current anxiety. Patient verbalizes understanding. Also strongly encouraged pt to follow-up with his PCP in 12 hours as scheduled. He verbalizes that he is not suicidal or homicidal.   For  hypertension, resume taking medications as directed.  He declines STI screening, adamantly denying STI symptoms or risks.   Final Clinical Impressions(s) / UC Diagnoses   Final diagnoses:  Urinary frequency  Anxiety state  Essential hypertension     Discharge Instructions     -For anxiety, start the hydroxyzine as prescribed by your doctor. Please see them as scheduled tomorrow morning.  -We're sending a culture of your urine. If this is positive for any bacteria, we'll call you and send in an antibiotic.  -If you have any thoughts of self-harm, suicide, or  hurting others- please head straight to the ED.     ED Prescriptions    None     PDMP not reviewed this encounter.   Rhys Martini, PA-C 01/25/20 Ernestina [place]

## 2020-01-25 NOTE — Discharge Instructions (Addendum)
-  For anxiety, start the hydroxyzine as prescribed by your doctor. Please see them as scheduled tomorrow morning.  -We're sending a culture of your urine. If this is positive for any bacteria, we'll call you and send in an antibiotic.  -If you have any thoughts of self-harm, suicide, or hurting others- please head straight to the ED.

## 2020-01-25 NOTE — ED Triage Notes (Signed)
Pt presents with fatigue, urgency, frequency X 2 days. Pt denies burning sensation when urinating. He states he drinks a lot of soda. Pt states he has been feeling very anxious.

## 2020-01-27 LAB — URINE CULTURE: Culture: NO GROWTH
